# Patient Record
Sex: Male | Born: 1995 | Hispanic: Yes | Marital: Single | Smoking: Never smoker
Health system: Southern US, Community
[De-identification: ages and names within clinical notes are randomized; demographics above are authoritative.]

## PROBLEM LIST (undated history)

## (undated) ENCOUNTER — Emergency Department (HOSPITAL_BASED_OUTPATIENT_CLINIC_OR_DEPARTMENT_OTHER): Admission: EM

## (undated) DIAGNOSIS — T7840XA Allergy, unspecified, initial encounter: Secondary | ICD-10-CM

---

## 2017-02-01 DIAGNOSIS — M7652 Patellar tendinitis, left knee: Secondary | ICD-10-CM | POA: Insufficient documentation

## 2017-08-17 DIAGNOSIS — K5900 Constipation, unspecified: Secondary | ICD-10-CM | POA: Insufficient documentation

## 2017-11-20 DIAGNOSIS — F411 Generalized anxiety disorder: Secondary | ICD-10-CM | POA: Insufficient documentation

## 2020-04-11 ENCOUNTER — Encounter (HOSPITAL_COMMUNITY): Payer: Self-pay | Admitting: Emergency Medicine

## 2020-04-11 ENCOUNTER — Emergency Department (HOSPITAL_COMMUNITY)

## 2020-04-11 ENCOUNTER — Other Ambulatory Visit: Payer: Self-pay

## 2020-04-11 ENCOUNTER — Emergency Department (HOSPITAL_COMMUNITY)
Admission: EM | Admit: 2020-04-11 | Discharge: 2020-04-12 | Disposition: A | Discharge status: Home / Self Care | Attending: Emergency Medicine | Admitting: Emergency Medicine

## 2020-04-11 DIAGNOSIS — R002 Palpitations: Secondary | ICD-10-CM | POA: Diagnosis not present

## 2020-04-11 HISTORY — DX: Allergy, unspecified, initial encounter: T78.40XA

## 2020-04-11 LAB — BASIC METABOLIC PANEL
Anion gap: 11 (ref 5–15)
BUN: 12 mg/dL (ref 6–20)
CO2: 26 mmol/L (ref 22–32)
Calcium: 9.4 mg/dL (ref 8.9–10.3)
Chloride: 103 mmol/L (ref 98–111)
Creatinine, Ser: 0.94 mg/dL (ref 0.61–1.24)
GFR, Estimated: >60 mL/min (ref >60)
Glucose, Bld: 87 mg/dL (ref 70–99)
Potassium: 3.8 mmol/L (ref 3.5–5.1)
Sodium: 140 mmol/L (ref 135–145)

## 2020-04-11 LAB — CBC
HCT: 41.3 % (ref 39.0–52.0)
Hemoglobin: 13.9 g/dL (ref 13.0–17.0)
MCH: 31.4 pg (ref 26.0–34.0)
MCHC: 33.7 g/dL (ref 30.0–36.0)
MCV: 93.2 fL (ref 80.0–100.0)
Platelets: 213 10*3/uL (ref 150–400)
RBC: 4.43 MIL/uL (ref 4.22–5.81)
RDW: 11.9 % (ref 11.5–15.5)
WBC: 5.2 10*3/uL (ref 4.0–10.5)
nRBC: 0 % (ref 0.0–0.2)

## 2020-04-11 LAB — TROPONIN I (HIGH SENSITIVITY): Troponin I (High Sensitivity): 4 ng/L (ref <18)

## 2020-04-11 NOTE — ED Triage Notes (Signed)
Pt reports intermittent chest pain x 3 weeks.  States he will wake up and having cramping to L side of chest with inspirations.  Reports SOB when working out and having to take frequent breaks.  Went to an [facility] and sent to ED for further eval.

## 2020-04-12 LAB — TROPONIN I (HIGH SENSITIVITY): Troponin I (High Sensitivity): 4 ng/L (ref <18)

## 2020-04-12 MED ORDER — PROPRANOLOL HCL 40 MG PO TABS: 40.0000 mg | ORAL_TABLET | Freq: Three times a day (TID) | ORAL | 0 refills | Status: AC | PRN

## 2020-04-12 NOTE — ED Provider Notes (Signed)
[facility] EMERGENCY DEPARTMENT Provider Note   CSN: [id number] Arrival date & time: 04/11/20  1617     History Chief Complaint  Patient presents with  . Chest Pain    Louis Stafford is a 25 y.o. male with a history of allergic rhinitis who presents to the emergency department with a chief complaint of palpitations.  The patient reports that he has been having intermittent episodes of palpitations for the last few weeks.  Symptoms will last for a couple of minutes before resolving spontaneously.  He has had some associated shortness of breath when he noticed the episodes while he was working out of the gym and has been cutting back on his workouts due to the sensation.  He did have 1 episode 2 days ago of pressure-like chest pain when he awoke, but this is since resolved and not recurred.  He is not having shortness of breath when he is not having palpitations.  He denies dizziness, lightheadedness, syncope, seizure-like activity, numbness, weakness, visual changes, nausea, vomiting, diarrhea, back pain, abdominal pain.  Reports that he takes a daily Zyrtec.  He is a non-smoker.  He reports social alcohol use with no recent increase or decrease his intake.  No illicit or recreational substance use.  He drinks approximately 2 cups of caffeine daily with no change in his caffeine intake.  He reports that he is currently going through a break-up.  They attempted to work it out, but the relationship ended over the last few weeks.  Otherwise, he denies any other stressors, anxiety, depression.  No family history of sudden cardiac death, CAD, known arrhythmias, or thyroid disease.  The history is provided by the patient. No language interpreter was used.       Past Medical History:  Diagnosis Date  . Allergies     There are no problems to display for this patient.   History reviewed. No pertinent surgical history.     No family history on file.  Social History    Tobacco Use  . Smoking status: Never Smoker  . Smokeless tobacco: Never Used  Substance Use Topics  . Alcohol use: Not Currently  . Drug use: Not Currently    Home Medications Prior to Admission medications   Medication Sig Start Date End Date Taking? Authorizing Provider  propranolol (INDERAL) 40 MG tablet Take 1 tablet (40 mg total) by mouth 3 (three) times daily as needed. 04/12/20  Yes Lus Kriegel A, PA-C    Allergies    Patient has no allergy information on record.  Review of Systems   Review of Systems  Constitutional: Negative for appetite change, chills, fatigue and fever.  Respiratory: Negative for cough, shortness of breath and wheezing.   Cardiovascular: Positive for palpitations. Negative for chest pain and leg swelling.  Gastrointestinal: Negative for abdominal pain, diarrhea, nausea and vomiting.  Genitourinary: Negative for dysuria.  Musculoskeletal: Negative for back pain, myalgias, neck pain and neck stiffness.  Skin: Negative for rash.  Allergic/Immunologic: Negative for immunocompromised state.  Neurological: Negative for headaches.  Psychiatric/Behavioral: Negative for confusion.    Physical Exam Updated Vital Signs BP (!) 137/97 (BP Location: Left Arm)   Pulse (!) 58   Temp 97.9 F (36.6 C) (Oral)   Resp 18   SpO2 98%   Physical Exam Vitals and nursing note reviewed.  Constitutional:      General: He is not in acute distress.    Appearance: He is well-developed. He is not ill-appearing, toxic-appearing or  diaphoretic.  HENT:     Head: Normocephalic.     Mouth/Throat:     Mouth: Mucous membranes are moist.  Eyes:     Extraocular Movements: Extraocular movements intact.     Conjunctiva/sclera: Conjunctivae normal.     Pupils: Pupils are equal, round, and reactive to light.  Cardiovascular:     Rate and Rhythm: Normal rate and regular rhythm.     Pulses: Normal pulses.     Heart sounds: Normal heart sounds. No murmur heard. No friction  rub. No gallop.      Comments: Heart is regular rate and rhythm without murmurs rubs or gallops. 2+ peripheral pulses bilaterally Pulmonary:     Effort: Pulmonary effort is normal. No respiratory distress.     Breath sounds: No stridor. No wheezing, rhonchi or rales.  Chest:     Chest wall: No tenderness.  Abdominal:     General: There is no distension.     Palpations: Abdomen is soft. There is no mass.     Tenderness: There is no abdominal tenderness. There is no right CVA tenderness, left CVA tenderness, guarding or rebound.     Hernia: No hernia is present.  Musculoskeletal:     Cervical back: Neck supple.     Right lower leg: No edema.     Left lower leg: No edema.  Skin:    General: Skin is warm and dry.     Capillary Refill: Capillary refill takes less than 2 seconds.     Coloration: Skin is not jaundiced.     Findings: No bruising.  Neurological:     Mental Status: He is alert.  Psychiatric:        Mood and Affect: Mood is anxious.        Behavior: Behavior normal.     ED Results / Procedures / Treatments   Labs (all labs ordered are listed, but only abnormal results are displayed) Labs Reviewed  BASIC METABOLIC PANEL  CBC  TROPONIN I (HIGH SENSITIVITY)  TROPONIN I (HIGH SENSITIVITY)    EKG EKG Interpretation  Date/Time:  Sunday April 11 2020 16:37:54 EST Ventricular Rate:  73 PR Interval:  134 QRS Duration: 104 QT Interval:  364 QTC Calculation: 401 R Axis:   88 Text Interpretation: Normal sinus rhythm Possible Inferior infarct , age undetermined Abnormal ECG No old tracing to compare Confirmed by Glick, David ([id number]) on 04/11/2020 11:14:01 PM   Radiology DG Chest 2 View  Result Date: 04/11/2020 CLINICAL DATA:  Imaging chest pain x3 weeks, short of breath when working out EXAM: CHEST - 2 VIEW COMPARISON:  None. FINDINGS: The heart size and mediastinal contours are within normal limits. No focal consolidation. Pleural effusion. No pneumothorax. The  visualized skeletal structures are unremarkable. IMPRESSION: No active cardiopulmonary disease. Electronically Signed   By: Jeffrey  Waltz MD   On: 04/11/2020 17:13    Procedures Procedures   Medications Ordered in ED Medications - No data to display  ED Course  I have reviewed the triage vital signs and the nursing notes.  Pertinent labs & imaging results that were available during my care of the patient were reviewed by me and considered in my medical decision making (see chart for details).    MDM Rules/Calculators/A&P                          24  year old male with a history of allergic rhinitis who presents the emergency department with intermittent palpitations  for the last few weeks.  He has had mild associated shortness of breath.  He did have 1 episode of chest pain 2 days ago that resolved spontaneously, but no recurrent episodes.  No constitutional symptoms.  Vital signs are stable.  EKG with normal sinus rhythm.    Labs and imaging have been reviewed and independently interpreted by me.  Chest x-ray is unremarkable.  Delta troponin trend is flat.  No metabolic derangements.  CBC is reassuring.  On exam, patient is nontoxic and in no acute distress.  He has no complaints at this time.  Doubt cardiomyopathy, MI, heart block, WPW, valvular heart disease, endocrine disorder, metabolic disorder.  Doubt PE, ACS, aortic dissection, tension pneumothorax.  Strong suspicion that symptoms are secondary to anxiety and recent stressors regarding break-up.  We will trial the patient on a short course of outpatient propranolol.  He has a follow-up appointment with his PCP next week.  ER return precautions given.  He is hemodynamically stable and in no acute distress.  Safe for discharge to home with outpatient follow-up as indicated.   Final Clinical Impression(s) / ED Diagnoses Final diagnoses:  Palpitations with regular cardiac rhythm    Rx / DC Orders ED Discharge Orders          Ordered    propranolol (INDERAL) 40 MG tablet  3 times daily PRN        04/12/20 0428           Frederik Pear A, PA-C 04/12/20 0523    Dione Booze, MD 04/12/20 [phone]

## 2020-04-12 NOTE — Discharge Instructions (Addendum)
Thank you for allowing me to care for you today in the Emergency Department.   You were seen today for chest pain.  Your work-up was suspicious for palpitations.  Palpitations can be caused by many things.  You can try completely cutting out caffeine.  Some people will develop palpitations with any amount of caffeine.  Make sure that you are drinking at least 64 ounces of fluids daily.  Sometimes mild dehydration can cause palpitations.  They can also be caused by increasing stress or anxiety.  You can follow-up with primary care if you continue to have increased stressors.  [facility] is also a good resource and I have provided you with a referral above.  You can also try taking 1 tablet of propanolol once every 8 hours as needed for palpitations.  Please discuss how you are feeling after starting this medication when you follow-up with your PCP next week.  If you continue to have palpitations despite trialing propanolol, you may want to follow-up with cardiology as you may benefit from a Zio patch.  Their office information is listed above.  However, if your symptoms seem much improved after trying the propanolol you do not necessarily need to follow-up with cardiology.  You can have your blood pressure rechecked at your primary care provider's office.  Your blood pressure was in the 130s over 80s today.  Return to the emergency department if you pass out, if you develop respiratory distress, chest pain with sweating, new numbness or weakness, changes in your vision, or other new, concerning symptoms.

## 2020-12-20 ENCOUNTER — Other Ambulatory Visit: Payer: Self-pay

## 2020-12-20 ENCOUNTER — Ambulatory Visit (INDEPENDENT_AMBULATORY_CARE_PROVIDER_SITE_OTHER): Admitting: Physician Assistant

## 2020-12-20 ENCOUNTER — Encounter: Payer: Self-pay | Admitting: Physician Assistant

## 2020-12-20 VITALS — BP 115/73 | HR 67 | Temp 98.9°F | Ht 70.0 in | Wt 184.0 lb

## 2020-12-20 DIAGNOSIS — Z7689 Persons encountering health services in other specified circumstances: Secondary | ICD-10-CM

## 2020-12-20 DIAGNOSIS — J0110 Acute frontal sinusitis, unspecified: Secondary | ICD-10-CM

## 2020-12-20 MED ORDER — AMOXICILLIN-POT CLAVULANATE 875-125 MG PO TABS: 1.0000 | ORAL_TABLET | Freq: Two times a day (BID) | ORAL | 0 refills | Status: DC

## 2020-12-20 MED ORDER — DM-GUAIFENESIN ER 30-600 MG PO TB12
1.0000 | ORAL_TABLET | Freq: Two times a day (BID) | ORAL | 0 refills | Status: AC | PRN
Start: 2020-12-20 — End: ?

## 2020-12-20 MED ORDER — PREDNISONE 20 MG PO TABS: ORAL_TABLET | ORAL | 0 refills | Status: AC

## 2020-12-20 NOTE — Progress Notes (Signed)
Acute Office Visit  Subjective:    Patient ID: Louis Stafford, male    DOB: Mar 13, 1995, 25 y.o.   MRN: [id number]  Chief Complaint  Patient presents with   Sinusitis    HPI Patient is in today for c/o nasal congestion, fatigue, sore throat, cough with dark brown/green sputum. Symptoms ongoing for 9 days. Denies fever, chills or night sweats. Tested negative for Covid twice. Taking DayQuil with mild relief.   Past Medical History:  Diagnosis Date   Allergies     History reviewed. No pertinent surgical history.  History reviewed. No pertinent family history.  Social History   Socioeconomic History   Marital status: Single    Spouse name: Not on file   Number of children: Not on file   Years of education: Not on file   Highest education level: Not on file  Occupational History   Not on file  Tobacco Use   Smoking status: Never   Smokeless tobacco: Never  Substance and Sexual Activity   Alcohol use: Not Currently   Drug use: Not Currently   Sexual activity: Not on file  Other Topics Concern   Not on file  Social History Narrative   Not on file   Social Determinants of Health   Financial Resource Strain: Not on file  Food Insecurity: Not on file  Transportation Needs: Not on file  Physical Activity: Not on file  Stress: Not on file  Social Connections: Not on file  Intimate Partner Violence: Not on file    Outpatient Medications Prior to Visit  Medication Sig Dispense Refill   propranolol (INDERAL) 40 MG tablet Take 1 tablet (40 mg total) by mouth 3 (three) times daily as needed. 16 tablet 0   No facility-administered medications prior to visit.    Not on File  Review of Systems Review of Systems:  A fourteen system review of systems was performed and found to be positive as per HPI.    Objective:    Physical Exam General:  Well Developed, well nourished, appropriate for stated age.  Neuro:  Alert and oriented,  extra-ocular muscles intact   HEENT:  Normocephalic, atraumatic, tenderness of frontal sinus, no tenderness of maxillary sinus, scar tissue of both ears noted with some fluid in right TM, slight boggy turbinates, normal buccal mucosa, +mild cervical adenopathy Skin:  no gross rash, warm, pink. Cardiac:  RRR, S1 S2 Respiratory: CTA B/L, Not using accessory muscles, speaking in full sentences- unlabored. Vascular:  Ext warm, no cyanosis apprec. Psych:  No HI/SI, judgement and insight good, Euthymic mood. Full Affect.  BP 115/73   Pulse 67   Temp 98.9 F (37.2 C)   Ht 5\' 10"  (1.778 m)   Wt 184 lb (83.5 kg)   SpO2 97%   BMI 26.40 kg/m  Wt Readings from Last 3 Encounters:  12/20/20 184 lb (83.5 kg)    Health Maintenance Due  Topic Date Due   HPV VACCINES (1 - Male 2-dose series) Never done   HIV Screening  Never done   Hepatitis C Screening  Never done   INFLUENZA VACCINE  10/04/2020       Topic Date Due   HPV VACCINES (1 - Male 2-dose series) Never done     No results found for: TSH Lab Results  Component Value Date   WBC 5.2 04/11/2020   HGB 13.9 04/11/2020   HCT 41.3 04/11/2020   MCV 93.2 04/11/2020   PLT 213 04/11/2020   Lab Results  Component Value Date   NA 140 04/11/2020   K 3.8 04/11/2020   CO2 26 04/11/2020   GLUCOSE 87 04/11/2020   BUN 12 04/11/2020   CREATININE 0.94 04/11/2020   CALCIUM 9.4 04/11/2020   ANIONGAP 11 04/11/2020   No results found for: CHOL No results found for: HDL No results found for: LDLCALC No results found for: TRIG No results found for: CHOLHDL No results found for: HALP3X     Assessment & Plan:   Problem List Items Addressed This Visit   None Visit Diagnoses     Encounter to establish care    -  Primary   Acute non-recurrent frontal sinusitis       Relevant Medications   amoxicillin-clavulanate (AUGMENTIN) 875-125 MG tablet   predniSONE (DELTASONE) 20 MG tablet   dextromethorphan-guaiFENesin (MUCINEX DM) 30-600 MG 12hr tablet      Acute  non-recurrent frontal sinusitis: -Patient has s/s consistent with sinusitis with symptoms ongoing >7 days with minimal improvement with supportive care so will start oral antibiotic and corticosteroid therapy. Recommend to take decongestant as needed for cough. Follow up if symptoms fail to improve or worsen.  Meds ordered this encounter  Medications   amoxicillin-clavulanate (AUGMENTIN) 875-125 MG tablet    Sig: Take 1 tablet by mouth 2 (two) times daily.    Dispense:  14 tablet    Refill:  0    Order Specific Question:   Supervising Provider    Answer:   Nani Gasser D [[id number]]   predniSONE (DELTASONE) 20 MG tablet    Sig: Take 2 tablets by mouth x 2 days, 1 tablets x 2 days, 0.5 tablet x 2 days    Dispense:  7 tablet    Refill:  0    Order Specific Question:   Supervising Provider    Answer:   Agapito Games [[id number]]   dextromethorphan-guaiFENesin (MUCINEX DM) 30-600 MG 12hr tablet    Sig: Take 1 tablet by mouth 2 (two) times daily as needed for cough.    Dispense:  30 tablet    Refill:  0    Order Specific Question:   Supervising Provider    Answer:   Nani Gasser D [[id number]]     Mayer Masker, PA-C

## 2021-06-03 ENCOUNTER — Other Ambulatory Visit: Payer: Self-pay | Admitting: Physician Assistant

## 2021-06-03 DIAGNOSIS — L237 Allergic contact dermatitis due to plants, except food: Secondary | ICD-10-CM

## 2021-06-03 MED ORDER — MUPIROCIN 2 % EX OINT: 1.0000 "application " | TOPICAL_OINTMENT | Freq: Two times a day (BID) | CUTANEOUS | 0 refills | Status: AC

## 2021-06-03 MED ORDER — METHYLPREDNISOLONE 4 MG PO TBPK
ORAL_TABLET | ORAL | 0 refills | Status: AC
Start: 2021-06-03 — End: ?

## 2021-06-03 NOTE — Progress Notes (Signed)
Rx sent for poison ivy dermatitis. MA, PA-C  ?

## 2021-06-06 ENCOUNTER — Other Ambulatory Visit: Payer: Self-pay | Admitting: Physician Assistant

## 2021-06-06 DIAGNOSIS — L03113 Cellulitis of right upper limb: Secondary | ICD-10-CM

## 2021-06-06 MED ORDER — DOXYCYCLINE HYCLATE 100 MG PO TABS: 100.0000 mg | ORAL_TABLET | Freq: Two times a day (BID) | ORAL | 0 refills | Status: AC

## 2021-12-19 IMAGING — DX DG CHEST 2V
2 series · 2 of 2 positions shown · non-contrast
Comparison: None.

CLINICAL DATA: Imaging chest pain x3 weeks, short of breath when
working out

EXAM:
CHEST - 2 VIEW

[chest pa]
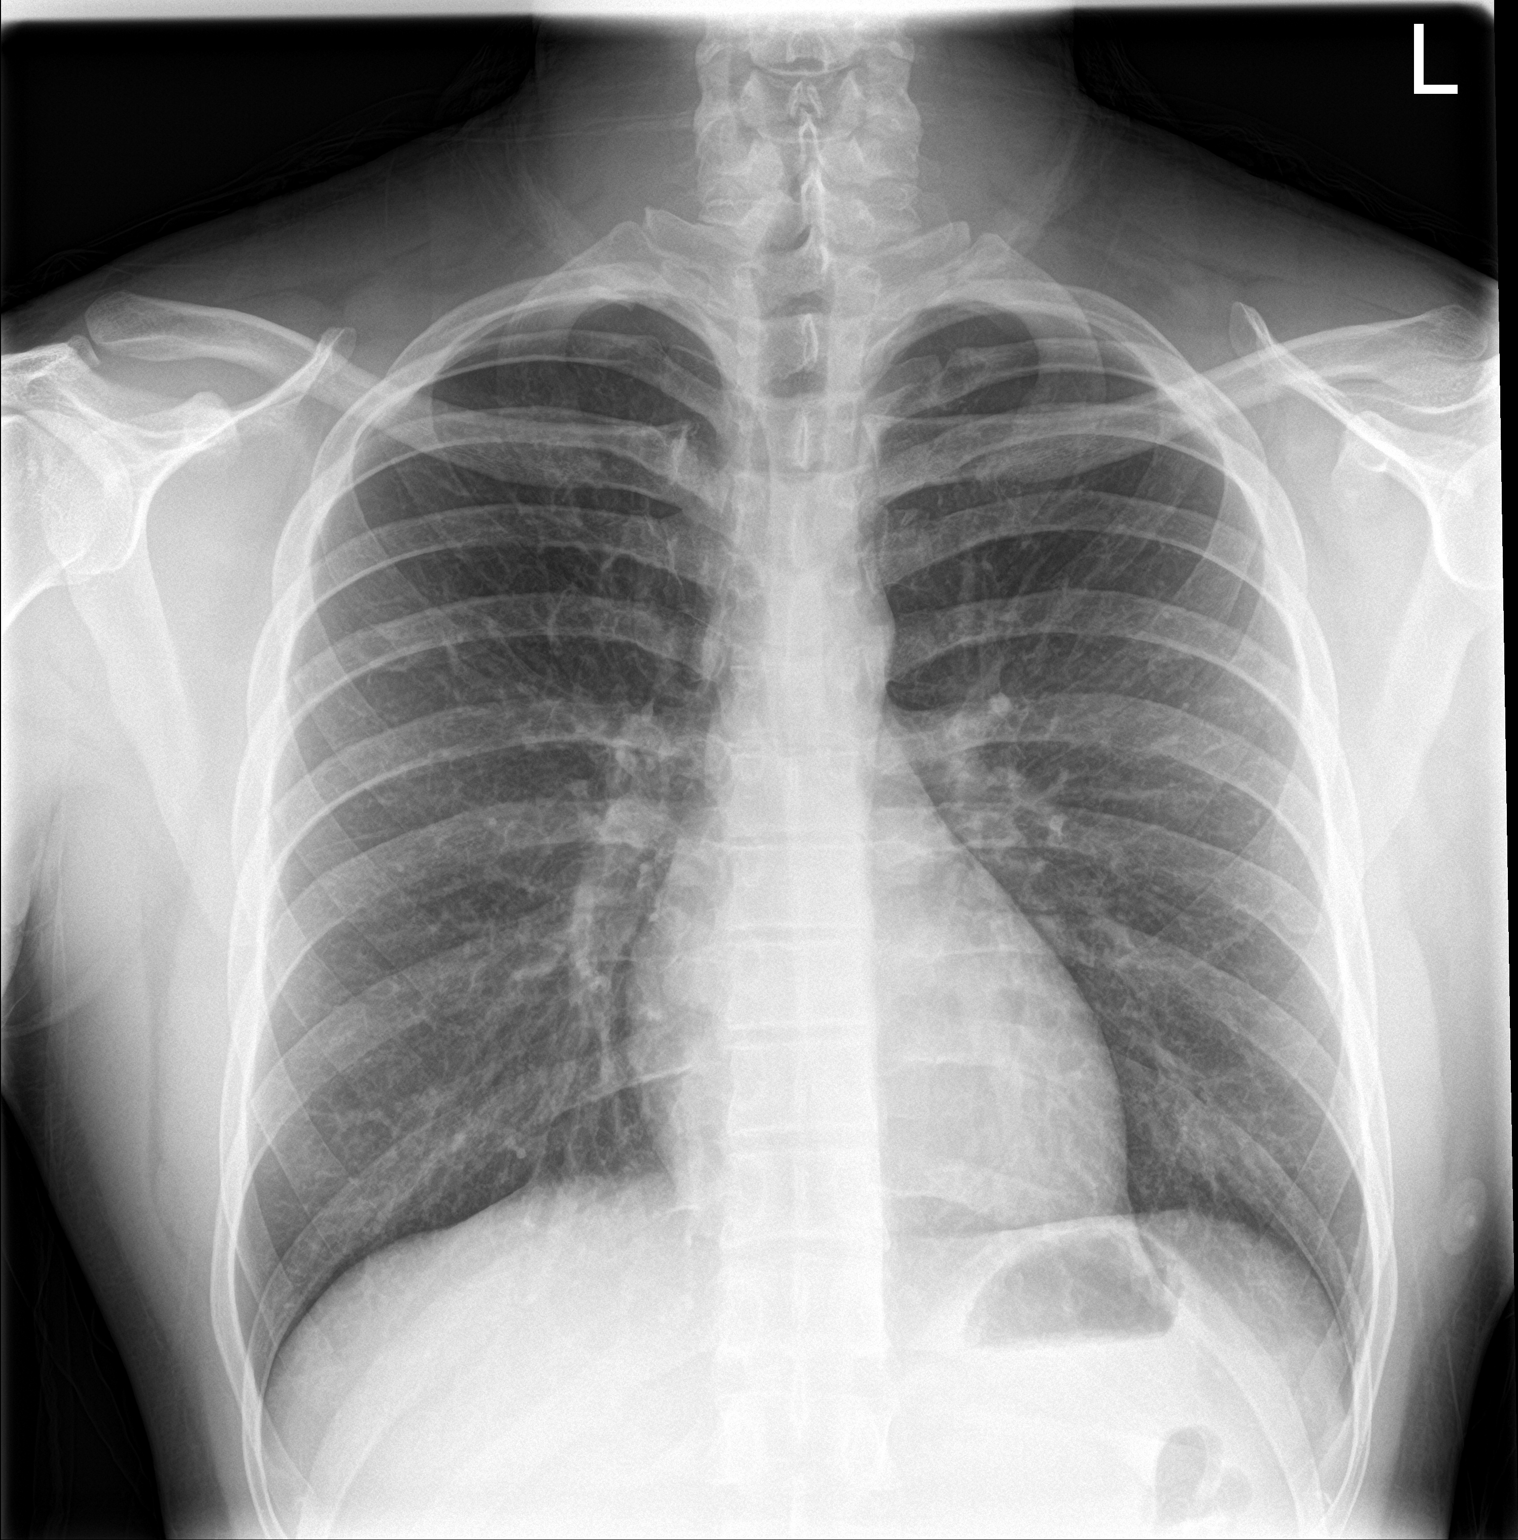

[chest lat]
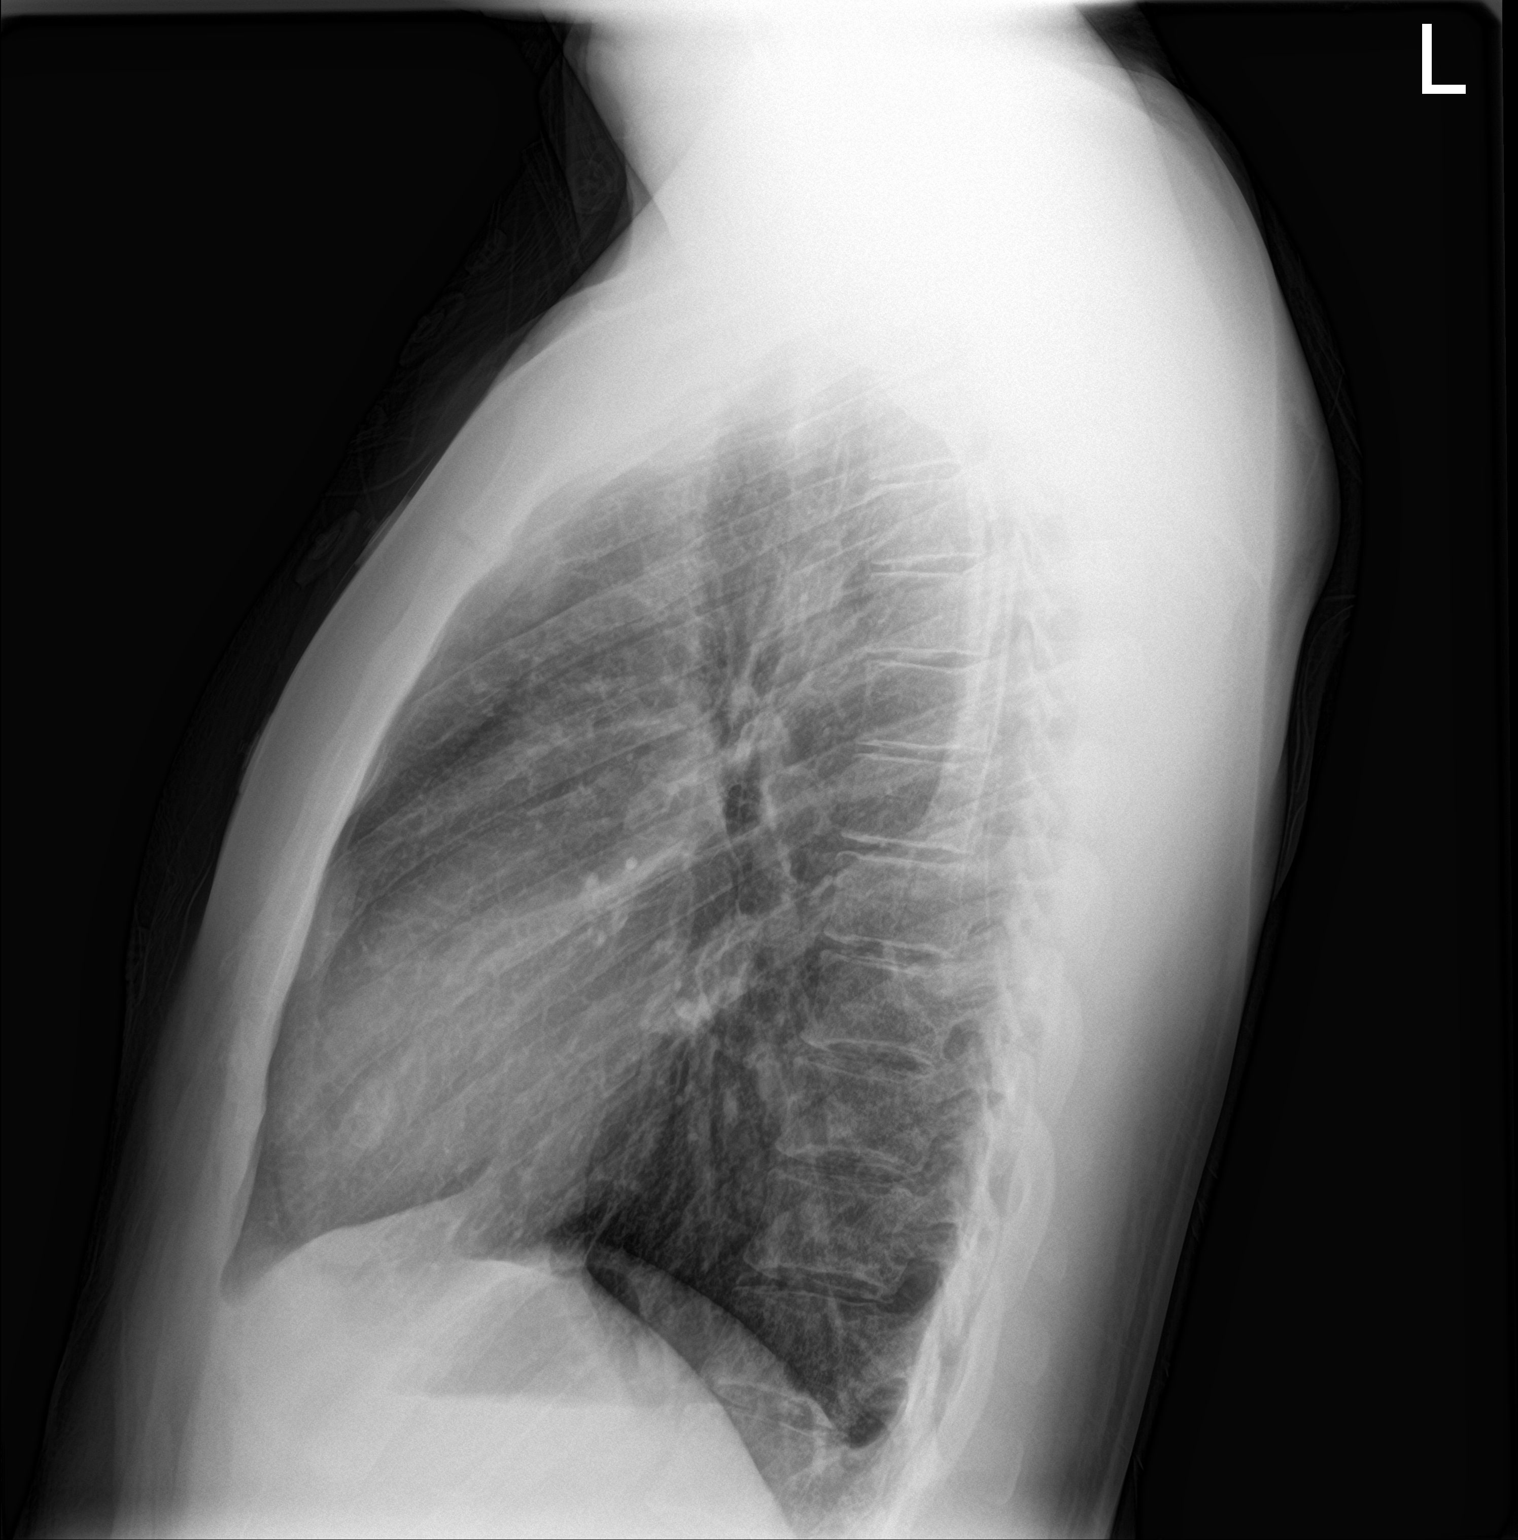

[2 of 2 positions shown; findings below may reference images not displayed]

FINDINGS: The heart size and mediastinal contours are within normal limits. No
focal consolidation. Pleural effusion. No pneumothorax. The
visualized skeletal structures are unremarkable.
IMPRESSION: No active cardiopulmonary disease.
